# Patient Record
Sex: Female | Born: 1950 | Race: White
Health system: Northeastern US, Academic
[De-identification: ages and names within clinical notes are randomized; demographics above are authoritative.]

## PROBLEM LIST (undated history)

## (undated) NOTE — Progress Notes (Signed)
Formatting of this note is different from the original.  Kelsey Ponce was seen today.  The patient is seen as a referral from Dr Durward Mallard.  She has had injections by Dr Shelly Rubenstein.  D Mc Gin  did epidurals that she felt did not help much but the piriformis injections she thinks helps some.  She saw Dr Delman Cheadle from [facility].  He did epidurals as well. He tried to get into L5 and the nurse notes he had a hard time and was perhaps in the patient's perception unsuccessful.     Dr Carlene Coria recommenced an SI injection.  It helped for 2 days.  It was injected on the left.  It sounds like it is the injection that helped the most.     Past Medical History:   Diagnosis Date    Allergy, unspecified not elsewhere classified     Arthritis     Carpal tunnel syndrome     Chronic kidney disease     GERD (gastroesophageal reflux disease)     Headache(784.0)     Low back pain      Past Surgical History:   Procedure Laterality Date    BUNIONECTOMY      CARPAL TUNNEL RELEASE Bilateral     CARPAL TUNNEL RELEASE      CESAREAN SECTION      COLECTOMY/COLON RESECTION      COLONOSCOPY      LITHOTRIPSY       Current Outpatient Medications on File Prior to Visit   Medication Sig Dispense Refill    Cholecalciferol (VITAMIN D-3 PO) Take 2,000 Units by mouth daily      Losartan Potassium 50 MG Oral Tablet (COZAAR) Take 50 mg by mouth daily      polyethylene glycol (MIRALAX) powder Take 17 g by mouth every other day      Rosuvastatin Calcium 10 MG Oral Tablet (CRESTOR) Take 10 mg by mouth daily      esomeprazole (NEXIUM) 40 MG capsule Take 40 mg by mouth as needed      halobetasol (ULTRAVATE) 0.05 % cream Apply  topically Two Times Daily.      [DISCONTINUED] desoximetasone (TOPICORT) 0.25 % cream Apply  topically Two Times Daily. (Patient not taking: Reported on 01/08/2023)      [DISCONTINUED] estrogens, conjugated, (PREMARIN) 0.625 MG tablet Take 0.625 mg by mouth daily. (Patient not taking: Reported on 01/08/2023)      [DISCONTINUED] Multiple Vitamin  (MULTIVITAMIN) tablet Take 1 tablet by mouth daily. (Patient not taking: Reported on 01/08/2023)      [DISCONTINUED] pravastatin (PRAVACHOL) 20 MG tablet Take 20 mg by mouth daily. (Patient not taking: Reported on 01/08/2023)      [DISCONTINUED] predniSONE (DELTASONE) 10 MG tablet Take 10 mg by mouth daily. (Patient not taking: Reported on 01/08/2023)       No current facility-administered medications on file prior to visit.     Motor/ Sensory Examination    On sensory examination: In tact to light touch throughout all dermatomes bilaterally.     On motor examination :     Upper Extremity  Right  Left    Deltoid  5  5    Biceps  5  5    Triceps  5  5    Wrist Extensor  5  5    Wrist Flexor  5  5    Grip  5  5    Intrinsics  5  5    Lower Extremity  Iliopsoas  5  5    Quadriceps  5  5    Hamstrings  5  5    Tibialis anterior  5  5    EHL  5  5    Gastroc  5  5    Peroneals  5  5      She has a negative Thrust, compression and FABER    Impression/Discussions    1- SI pain left- Single injection helped.  Would repeat injection.  Typically we like to see 2 signs on physical exam be positive and at this point I do not see any that are positive certainly we can repeat the exam I would also repeat the injection based on the fact that rarely with an SI injection is usually the most telling of the findings.    We went over the I fuse implant.  I showed her a picture of it.  She will ask Dr. 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  She has a coronal deformity is balanced but this deformity would impact any potential lumbar spinal surgery     3- Lumbar stenosis with radiculopathy - She hs lumbar degeneration and has had injections     She mentions a spinal cord stimulator but is not interested in it.     She would be a major reconstructive surgery where she to have surgery to address the lumbar stenosis if she  has the surgery based on the congenital deformity.      She will continue chiropractic care as well .      Electronically signed by Lonia Skinner, MD at 01/09/2023 10:08 AM EST

## (undated) NOTE — Telephone Encounter (Signed)
Formatting of this note might be different from the original.  Previous patient from [facility] would like to talk to the provider.   Electronically signed by Nolon Bussing Real, Marisol at 03/06/2021  9:56 AM CDT

## (undated) NOTE — Progress Notes (Signed)
Formatting of this note might be different from the original.  See paper documentation for visit information and progress note.    Hipolito Bayley, MD   Electronically signed by Lennox Laity, MD at 04/01/2023  4:12 PM EDT

## (undated) NOTE — Progress Notes (Signed)
Formatting of this note is different from the original.    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  Advised that insurance may not cover the Botox into the piriformis   Follow up 6-8 weeks with Dr. Delman Cheadle, patient can call for sooner appointment if needed      No specialty comments available.   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 There is scoliosis noted to the lower back which is making the imaging harder to interpret  CT:  [place]:  EMG/NCV:  Other:  Labs:       Subjective:   Patient ID: Kelsey Ponce is a 51 years year old female. DOB: 03/16/1951     The patient stated that the pain, has increased to where she can not find a comfortable position, she stated that the pain increase started as of saturday the 28th.   The patient stated that just touching the left leg is painful. She stated that she can not get any kind of relief from the pain. She stated the Gabapentin is not  helping her.      Back Pain  The pain is present in the gluteal. The quality of the pain is described as aching and stabbing. Radiates to: the side of the left leg  The pain is at a severity of 8/10. The pain is the same all the time. The symptoms are aggravated by bending, sitting, standing and twisting (walking). Associated symptoms include numbness (from the ankle on the left foot to the toes ).         Current medications/treatments:  Aleve OTC very rarely taken for back pain   Physical therapy for back pain completed 8 sessions so far with [facility]- Patient states Traction is helping the most, so far Patient is receiving 50% relief, last session was 05/16/22.   Home Exercises given by PT      Past medications tried:  Oxycodone- N/V   Other meds tried though the years made her sick    Past treatments tried:  Stim trial but no permanent placement as of 01/30/2022   Physical therapy at [facility] for lower back until 06/2021 with 6 sessions and was told by therapist that there isn't much that they can do that would be beneficial for her.   Acupuncture 2 sessions as of 01/30/2022 with no relief reported   Physical therapy for back pain- completed more than 12 sessions [facility]  Past injections tried (misc.):    Past injections tried (neck/mid back):    Past injections tried (low back):  12/06/2015 Lumbar Transforaminal epidural injection Left L4-L5, and L5-S1 by Dr. Dallie Dad   07/10/2016 Lumbar Transforaminal epidural injection Left L1-L2, L2-L3 by Dr. Dallie Dad good relief until 01/2021  05/30/21  Lumbar Transforaminal epidural injection Left L1/2 and L2/3 with short term relief compared to the previous injections done 2017  07/05/21 injection by Dr Grace Isaac Lumbar interlaminar block with sedation L4/5 with 50% relief per office note  02/04/2022 Left L4 & L5 Transforaminal Lumbar Epidural Steroid Injection- about 10 days with moderate relief then mild till March 10th then no relief.   03/07/22 Dr  Valetta Close LeftPiriformis Muscle Steroid Injection with 50% relief as of 03/26/2022 but took about 2 weeks to start (had increased pain 03/08/22)  06/03/22 LEFT S1 TRANSFORAMINAL LUMBAR EPIDURAL STEROID INJECTION- 50-60% relief as of 07/16/22   07/31/2022 Left L5,S1 Transforaminal Lumbar Epidural Steroid Injection;  Aborted left S1 -no relief     Past injections (upper extremity):    Past injections (lower extremity):   09/12/21 Xray guided injection by Dr Grace Isaac left piriformis injection no sedation with some relief reported as of 01/30/2022   02/28/2022 Trigger Point Injection with steroid with mild relief a few days        Review of Systems   Musculoskeletal: Positive for back pain.   Neurological: Positive for numbness (from the ankle on the left foot to the toes ).     Allergies   Allergen Reactions   ? Ciprofloxacin Hives     Other reaction(s): Joint Pain   ? Levofloxacin Hives     Other reaction(s): Hives   ? Oxycodone      Other reaction(s): Nausea/Vomiting  Other reaction(s): Nausea/Vomiting     ? Phenazopyridine Itching   ? Paroxetine Hcl      Other reaction(s): Other (see comments)  Makes her hyper   ? Levaquin [Levofloxacin In D5w] Rash   ? Tetracyclines & Related Rash       Objective:   Physical Exam     Back exam:    Tenderness with Palpation   Right Left   Lumbar spine  None []   Mild []  Moderate []  Severe []   None []   Mild []  Moderate []   Severe []    Lumbar paraspinals  None []   Mild []  Moderate []  Severe []   None []   Mild []  Moderate []   Severe []    SI joints  None []   Mild []  Moderate []  Severe []   None []   Mild []  Moderate []  Severe []    Gluteals  None []   Mild []  Moderate []  Severe []   None []   Mild [x]  Moderate []  Severe []    Greater Trochanter  None []   Mild []  Moderate []  Severe []   None []   Mild []  Moderate []  Severe []      Patient points to the left buttock as source of pain.    Range of Motion  Flexion: Can reach with fingertips about I foot  from the floor  Extension: 20  Degrees causes leg down the  left leg      Right Left   Facet Loading  Positive []   Negative [x]   Positive []   Negative [x]    Sitting Root Test  Positive []   Negative [x]   Positive []   Negative [x]      Strength   Right Left   Knee Flexion  1 []  2 []  3[]  4[]   5[x]     1 []  2 []  3[]  4[]   5[x]     Knee Extension  1 []  2 []  3[]  4[]   5[x]    1 []  2 []  3[]  4[]   5[x]     Dorsi Flexion  1 []  2 []  3[]  4[]   5[x]    1 []  2 []  3[]  4[]   5[x]     Plantar Flexion  1 []  2 []  3[]  4[]   5[x]    1 []  2 []  3[]  4[]   5[x]     Hip Flexion  1 []  2 []  3[]  4[]   5[x]      1 []  2 []  3[]  4[]   5[x]       Hip internal and external rotation:   Right Hip Internal Rotation [x]  Normal without pain []  Mildly Reduced []  Moderately Reduced []  Markedly Reduced []  Painful Anteriorly  []  Painful Laterally  Right Hip External Rotation[]  Normal without pain []  Mildly Reduced []  Moderately Reduced []  Markedly Reduced []  Painful Anteriorly  []  Painful Laterally  Left Hip Internal Rotation [x]  Normal without pain []  Mildly Reduced []  Moderately Reduced []  Markedly Reduced []  Painful Anteriorly  []  Painful Laterally  Left Hip External Rotation[x]  Normal without pain []  Mildly Reduced []  Moderately Reduced []  Markedly Reduced []  Painful Anteriorly  []  Painful Laterally    Sensation  Right extremity  Normal [x]  Reduced  []    Left extremity     Normal []  Reduced  [x]   Toes in the left foot     Reflexes   Right Left   Patellar  0 []  1[]   2 []  3 []   4[]    0 []  1[]   2 []  3 []   4[]     Achilles  0 []  1[]   2 []  3 []   4[]    0 []  1[]   2 []  3 []   4[]       SI Joint Testing     Right Left   Compression Test Positive []  Negative [x]   Positive []  Negative [x]     Distraction Test Positive []  Negative [x]   Positive []  Negative [x]    Pelvic Thrust Positive []  Negative [x]   Positive []  Negative [x]    Faber Test Positive []  Negative [x]   Positive [x]  Negative []    Gaenslan's Test Positive []  Negative [x]   Positive []  Negative [x]    Sacral thrust Positive []  Negative [x]   Positive []  Negative [x]     Yeoman's test Positive  []  Negative [x]   Positive []  Negative [x]       Piriformis stretch negative and active piriformis test on the left positive     I, Barton Fanny, MA , am acting as a Education administrator for [employer]. Delman Cheadle, MD, who personally examined the patient and directed the documentation in this note on 09/23/2022  1:40 PM.  The documentation was reviewed and edited by Christella Scheuermann. Delman Cheadle, MD.      Electronically signed by Adela Glimpse, MD at 09/23/2022  4:45 PM EDT

## (undated) NOTE — Progress Notes (Signed)
Formatting of this note might be different from the original.  Ultrasound Guided Left Piriformis Muscle Injection    Date of Procedure:  09/23/22    Assessment: Piriformis Syndrome, Myofascial Pain    Technique: Comprehensive informed written consent was obtained.  The patient was placed prone. Confirmation of the procedure to be performed was obtained from the patient. The skin overlying the area to be injected was cleaned with alcohol preparation.  The transducer was cleansed with 70% Ethyl Alcohol, and sterile gel was used.   The transducer was placed in an anatomic transverse-oblique plane which is in the longitudinal axis of the piriformis muscle.  Ethyl chloride was used to anesthetize the skin.  A 22 gauge 2.75 inch  needle was directed from inferolateral to superomedial using an in plane approach.  The needle tip was directed to the within the piriformis muscle.  Particular care was taken to avoid the sciatic nerve, inferior gluteal artery, and variants of the sciatic nerve location were taken into account.  A solution of 40mg  kenalog, 4 cc 1% lidocaine was injected.  The needle was removed, and a Band-Aid was placed.    Complications: none    Blood Loss: minimal    Drug: Lidocaine 1%  Manf: Hospira  Lot: [id number]  Exp. Date: 10/2023  NDC: 8832-5498-26      Electronically signed by Adela Glimpse, MD at 09/23/2022  4:45 PM EDT

## (undated) NOTE — Progress Notes (Signed)
Formatting of this note is different from the original.  Please CC a copy of today?s note to Patient Care Team:  D Sherald Hess as PCP - General (Family Medicine)  Dayton Scrape (Physical Medicine and Rehabilitation)    CHIEF COMPLAINT/ HISTORY of PRESENT ILLNESS: We are seeing Kelsey Ponce in the office today for Evaluation of her lumbar pathology.  Patient states that she has been having low back in addition to left lower extremity symptoms.  Patient states that she has been having back and leg symptoms for over 15 years.  She was last treated by Dr. Alinda Dooms back in 2004 with various injections.  Patient states that she has been working with conservative modalities including anti-inflammatories and activity modification.  Since April 2014 patient states that her symptoms have worsened in the left leg.  Patient is referred to [place] for surgical evaluation.  Patient states that her pain is typically 7-8 out of 10 and worse with prolonged activities.   She has tried prednisone with minimal relief.  I reviewed patient's past medical history and review of systems with her in the office today.  She denies any bowel bladder symptoms.    PHYSICAL EXAMINATION: Her motor and sensory examination are grossly intact.  Patient is able to ambulate independently.  DTRs 2+ bilateral and symmetrical.  No clonus Babinski or Hoffman is noted.  Pain is noted with lumbar range of motion and paraspinal muscle palpation.    DIAGNOSTIC IMAGING/STUDIES: Patient has had an outside MRI of the thoracic and lumbar spine from June 2014 I reviewed images and report with her in the office today.  She has evidence of multilevel degenerative changes in addition to scoliosis and hemivertebra.  Stenosis at multiple levels are also noted.    ASSESSMENT/PLAN: 16 year old female with scoliosis and lumbar spinal stenosis with increasing back and also leg symptoms.  I discussed treatment options with patient at this point.  Surgery would  include a multilevel reconstruction from thoracic down to the pelvis.  We discussed benefits and risks of the surgery in the office today. Risks discussed in the office include but not limited to: Anesthesia with associated complications, deep venous thrombosis, pulmonary embolism, heart attack, stroke, infection, nonunion, hardware failure, need for return to the operating room, epidural fibrosis or scarring, persistent back and leg pain, CSF leakage with associated complications, footdrop, and other neurological deficits. We also answered all of patient's questions regarding the surgery.   Patient does not wish to proceed with surgery at this time point and would like to continue with conservative treatment modalities.  I would like to refer the patient to Dr. Jonny Ruiz  For continued conservative modalities.  We'll be happy to see the patient back after she has had a chance to work on conservative treatments.    Please note patient's visit / encounter today was greater than 60 minutes, and more than 35 minutes is spent in discussion, counseling, and coordination of his/her specific orthopedic/spinal pathology and treatment options.    Rudi Coco, MD  Associate Professor  Spine Surgery  [facility]  [place] [facility] [place]  Department of Orthopedic Surgery      Electronically signed by Rudi Coco, MD at 06/15/2013  6:21 PM EDT

---

## 2010-09-18 ENCOUNTER — Ambulatory Visit: Payer: Self-pay | Admitting: Otolaryngology

## 2010-09-18 ENCOUNTER — Ambulatory Visit: Payer: Self-pay | Admitting: Neurology

## 2010-09-18 ENCOUNTER — Ambulatory Visit: Payer: Self-pay

## 2010-09-18 NOTE — Progress Notes (Signed)
 Otolaryngology Consultation Note   Dear Dr. Alla Feeling:     I had the pleasure of seeing your patient, Kelsey Ponce in the [facility] today. She is a 59 year old female being seen for   evaluation of dizziness and cerumen impaction.     The patient lives in the unit area and as noted she gets vertigo associated   with changes in head position.  These occur especially when rolling over in   bed towards the right side.  She was referred to Dr. Idalia Needle and seems to   have been diagnosed with benign paroxysmal positional vertigo.  She was   given directions on how to perform the Epley maneuver.  They also want to   complete VNG testing but were unable to do the calorics due to cerumen   impaction.  Past Med/Surg Hx   Past medical history: The patient is otherwise healthy with no history of   high-blood pressure or diabetes.  She does have some history of asthma.     Social history: The patient is not currently employed.  She does not drink   alcohol or smoke.     Review of systems is reviewed by me and is significant for dizziness ear   pain upset stomach and bone pain.     There is a family history of heart disease and diabetes but no history of   hearing loss.  Physical Exam   The patient is well developed, well nourished, and in no acute distress.    They are able to communicate without assistance or hoaresness.  The patient   had a calm affect.  Head is atraumatic and normal cephalic.  There was no   sinus tenderness.  The salivary glands appeared normal.  Facial symmetry   was normal bilaterally.       Extraocular movements were intact, there was no nystagmus at rest.     Right ear:  The pinna was normal.  The external auditory canal completely   occluded with impacted cerumen which is removed and the operating   microscope.  The external auditory canals are found to be narrow.  The   tympanic membrane was intact.  No fluid was visible behind the tympanic   membrane.       Left ear:  The pinna  was normal. Again, the external auditory canal was   narrow with cerumen impaction. This impacted cerumen was removed allowing   me to visualize the tympanic membrane.  The external auditory canal was dry   and non-erythematous.  The tympanic membrane was intact.  No fluid was   visible behind the tympanic membrane.       The Dix-Halpike was performed on the right and left side.  The patient has   no nystagmus or vertigo on testing at this time.  This is likely because   the upper maneuver was recently successfully performed.  Assessment   1.  the cerumen impaction was removed, the patient will return to [facility] to   complete her VNG testing.     2.  I graded her diagnosis is likely benign proximal positional vertigo.  I   gave her a handout on the Epley maneuver at the request of the patient.  Signature   Electronically signed by: Deirdre Pippins  M.D.; 09/18/2010 2:50 PM EST.

## 2010-09-19 ENCOUNTER — Encounter: Payer: Self-pay | Admitting: Gastroenterology

## 2010-09-19 DIAGNOSIS — H8110 Benign paroxysmal vertigo, unspecified ear: Secondary | ICD-10-CM | POA: Insufficient documentation

## 2010-09-19 DIAGNOSIS — R42 Dizziness and giddiness: Secondary | ICD-10-CM | POA: Insufficient documentation

## 2010-09-19 DIAGNOSIS — H6120 Impacted cerumen, unspecified ear: Secondary | ICD-10-CM | POA: Insufficient documentation

## 2010-09-26 ENCOUNTER — Ambulatory Visit: Payer: Self-pay

## 2010-12-03 NOTE — Miscellaneous (Unsigned)
 Continuity of Care Record  Created: todo  From: ,   From:   From: [employer], EHR v10.2.7.53  To: Riviere, Cameryn  Purpose: Patient Use;       Problems  Diagnosis: Benign Paroxysmal Positional Vertigo (386.11)   Diagnosis: Cerumen Impaction (380.4)   Diagnosis: Dizziness (780.4)

## 2014-09-20 IMAGING — CR [HOSPITAL] CHEST 2VWS PA AND LAT
1 series · 2 of 2 positions shown · non-contrast
Comparison: 02/08/14

Chest 2 views
HISTORY: chronic obstructive pulmonary disease, cough, congestion, diabetes, HTN

[Series 1: pa · 0.17mm/px · 2 of 2 slices shown]
[im 1/2]
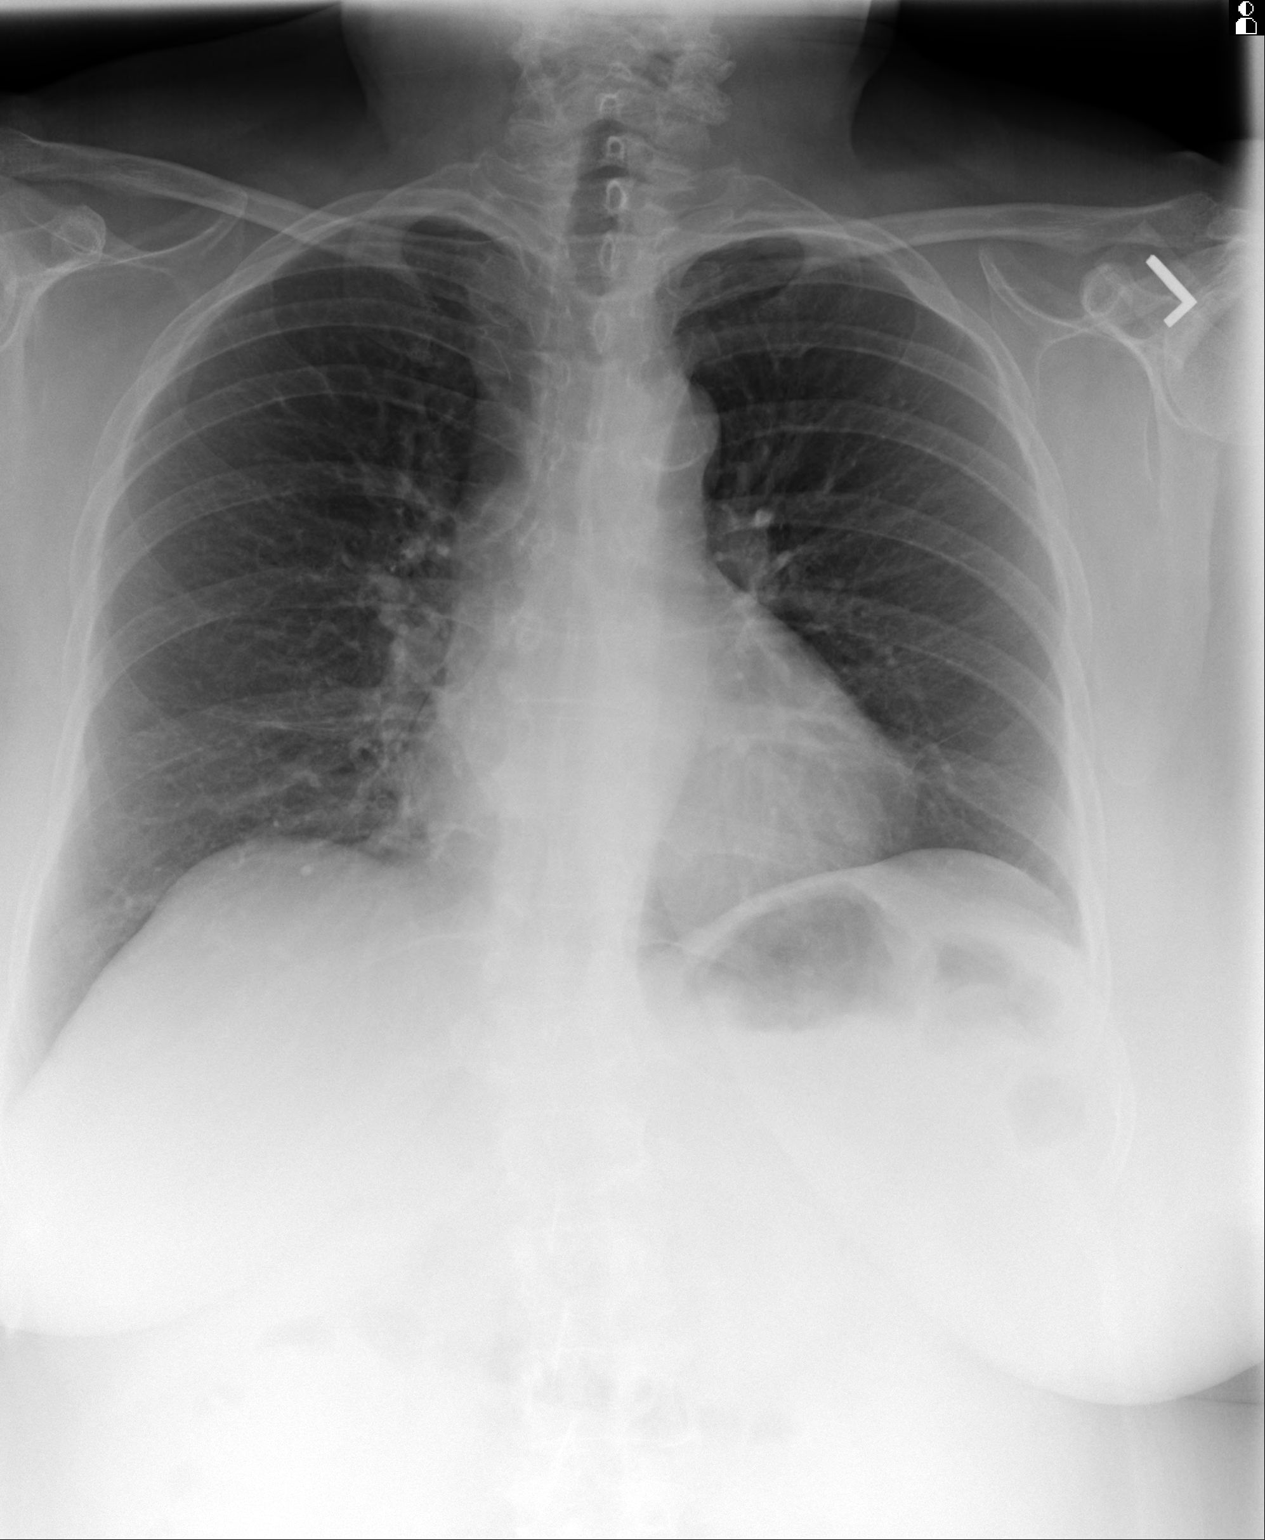
[im 2/2]
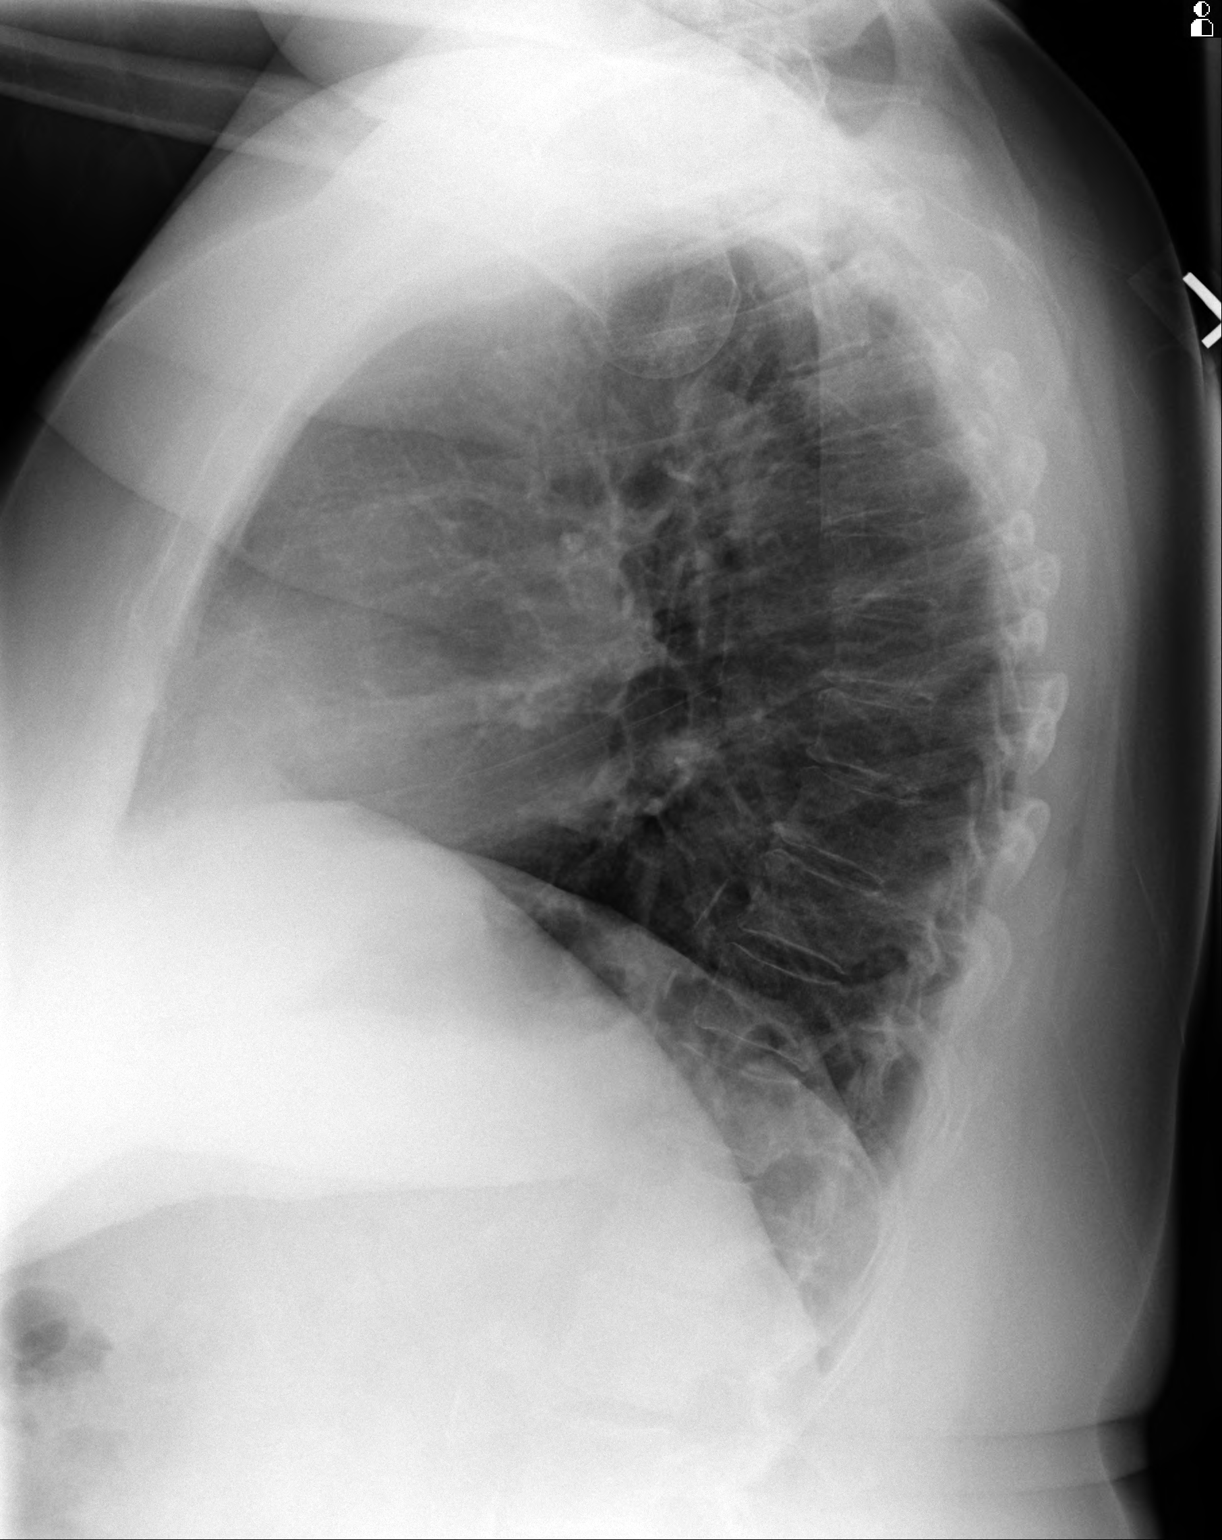

[2 of 2 positions shown; findings below may reference images not displayed]

FINDINGS: Comparison to the prior study shows no interval change. The heart, vessels, and pleural spaces look unchanged. Calcified slightly ectatic aorta.
IMPRESSION: No acute disease and no change from prior.

## 2022-10-06 ENCOUNTER — Inpatient Hospital Stay: Admit: 2022-10-06 | Discharge: 2022-10-06 | Disposition: A | Discharge status: Home / Self Care | Payer: Self-pay

## 2022-10-28 ENCOUNTER — Inpatient Hospital Stay: Admit: 2022-10-28 | Discharge: 2022-10-28 | Disposition: A | Discharge status: Home / Self Care | Payer: Self-pay

## 2023-01-08 ENCOUNTER — Inpatient Hospital Stay: Admit: 2023-01-08 | Discharge: 2023-01-08 | Disposition: A | Discharge status: Home / Self Care | Payer: Self-pay

## 2023-03-27 NOTE — Patient Instructions (Signed)
Continue your home exercise program for your back continue regular exercise this summer.    For flares of pain you stretching mountain ice and Aleve   Make another appointment with your pain management specialist to go over other options   I will see you in the fall do blood work prior next bring your blood pressure cuff to next visit

## 2023-05-05 ENCOUNTER — Ambulatory Visit: Payer: Medicare Other | Attending: Orthopedic Surgery | Admitting: Orthopedic Surgery

## 2023-05-05 ENCOUNTER — Encounter: Payer: Self-pay | Admitting: Orthopedic Surgery

## 2023-05-05 ENCOUNTER — Other Ambulatory Visit: Payer: Self-pay

## 2023-05-05 VITALS — BP 129/89 | HR 78 | Ht 59.0 in | Wt 174.0 lb

## 2023-05-05 DIAGNOSIS — M419 Scoliosis, unspecified: Secondary | ICD-10-CM

## 2023-05-05 NOTE — Progress Notes (Addendum)
Patient has longstanding congenital lumbosacral scoliosis deformity.  She has a long history of intermittent back pain.  She complains of intermittent buttock pain.  She has been evaluated for SI joint dysfunction and has had SI joint injections (1st injection had minimal relief and that the 2nd was temporarily helpful).  She is here today for an opinion on her back.    She complains of left buttock pain radiating down the lateral aspect of her leg over the dorsum of her foot.  It is in the classic L5 distribution.    On physical examination she has a full range of motion of her back she has a normal gait she is able to jump up and down.  Straight leg raising is normal.  Motor sensor reflex function normal in the bilateral lower extremities today.    I reviewed her MRI with her.  She has an angular lumbosacral deformity with an apex at L4-5 where she has a unilateral long segmented bar.  She has osteophytic formation around the region of the left L5 nerve root.    We talked today about the fact that she may be having left L5 radicular pain from her osteophyte formation in the region of her congenital deformity.    In my opinion is no role for surgical management of this and we discussed that in detail today.  I opined to her that she may benefit from a left L5 selective nerve root injection.  I answered all questions to his satisfaction.  We talked about NSAID medication and activities as tolerated.    She lives in [place] and she will follow-up with pain management [facility].

## 2023-05-06 ENCOUNTER — Other Ambulatory Visit: Payer: Self-pay

## 2023-05-07 ENCOUNTER — Other Ambulatory Visit: Payer: Self-pay
